# Patient Record
Sex: Female | Born: 1980 | Race: White | Hispanic: No | Marital: Single | Smoking: Never smoker
Health system: Southern US, Community
[De-identification: ages and names within clinical notes are randomized; demographics above are authoritative.]

## PROBLEM LIST (undated history)

## (undated) DIAGNOSIS — G40909 Epilepsy, unspecified, not intractable, without status epilepticus: Secondary | ICD-10-CM

---

## 2018-01-28 ENCOUNTER — Encounter: Payer: Self-pay | Admitting: *Deleted

## 2018-01-28 ENCOUNTER — Emergency Department: Payer: Medicare Other

## 2018-01-28 ENCOUNTER — Emergency Department
Admission: EM | Admit: 2018-01-28 | Discharge: 2018-01-28 | Disposition: A | Discharge status: Home / Self Care | Payer: Medicare Other | Attending: Emergency Medicine | Admitting: Emergency Medicine

## 2018-01-28 ENCOUNTER — Other Ambulatory Visit: Payer: Self-pay

## 2018-01-28 DIAGNOSIS — Y939 Activity, unspecified: Secondary | ICD-10-CM | POA: Insufficient documentation

## 2018-01-28 DIAGNOSIS — Y929 Unspecified place or not applicable: Secondary | ICD-10-CM | POA: Insufficient documentation

## 2018-01-28 DIAGNOSIS — X58XXXA Exposure to other specified factors, initial encounter: Secondary | ICD-10-CM | POA: Insufficient documentation

## 2018-01-28 DIAGNOSIS — M461 Sacroiliitis, not elsewhere classified: Secondary | ICD-10-CM

## 2018-01-28 DIAGNOSIS — Y999 Unspecified external cause status: Secondary | ICD-10-CM | POA: Diagnosis not present

## 2018-01-28 DIAGNOSIS — S39012A Strain of muscle, fascia and tendon of lower back, initial encounter: Secondary | ICD-10-CM

## 2018-01-28 DIAGNOSIS — S3992XA Unspecified injury of lower back, initial encounter: Secondary | ICD-10-CM | POA: Diagnosis present

## 2018-01-28 HISTORY — DX: Epilepsy, unspecified, not intractable, without status epilepticus: G40.909

## 2018-01-28 LAB — URINALYSIS, COMPLETE (UACMP) WITH MICROSCOPIC
BACTERIA UA: NONE SEEN
BILIRUBIN URINE: NEGATIVE
GLUCOSE, UA: NEGATIVE mg/dL
HGB URINE DIPSTICK: NEGATIVE
KETONES UR: NEGATIVE mg/dL
LEUKOCYTES UA: NEGATIVE
NITRITE: NEGATIVE
PROTEIN: NEGATIVE mg/dL
Specific Gravity, Urine: 1.029 (ref 1.005–1.030)
pH: 5 (ref 5.0–8.0)

## 2018-01-28 LAB — POCT PREGNANCY, URINE: Preg Test, Ur: NEGATIVE

## 2018-01-28 MED ORDER — CYCLOBENZAPRINE HCL 10 MG PO TABS: 10.0000 mg | ORAL_TABLET | Freq: Three times a day (TID) | ORAL | 0 refills | Status: AC | PRN

## 2018-01-28 MED ORDER — KETOROLAC TROMETHAMINE 10 MG PO TABS: 10.0000 mg | ORAL_TABLET | Freq: Three times a day (TID) | ORAL | 0 refills | Status: AC

## 2018-01-28 MED ORDER — KETOROLAC TROMETHAMINE 30 MG/ML IJ SOLN
30.0000 mg | Freq: Once | INTRAMUSCULAR | Status: AC
  Administered 2018-01-28: 30 mg via INTRAMUSCULAR
  Filled 2018-01-28: qty 1

## 2018-01-28 MED ORDER — ORPHENADRINE CITRATE 30 MG/ML IJ SOLN
60.0000 mg | INTRAMUSCULAR | Status: AC
  Administered 2018-01-28: 60 mg via INTRAMUSCULAR
  Filled 2018-01-28: qty 2

## 2018-01-28 NOTE — Discharge Instructions (Addendum)
Your exam and x-rays are negative for any acute spinal injury or any acute fractures. You should take the prescription meds as directed. Consider applying moist heat or ice to the sore muscles. Follow-up with your provider or a spine specialist for any ongoing symptoms.
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
## 2019-12-10 IMAGING — CR DG LUMBAR SPINE COMPLETE 4+V
1 series · 5 of 5 positions shown · non-contrast
Comparison: None.

CLINICAL DATA: Bilateral hip and lower back pain. Pain radiates
down left leg.

EXAM:
LUMBAR SPINE - COMPLETE 4+ VIEW

[Series 1: dg lumbar spine complete 4 +v · 0.14mm/px · 5 of 5 slices shown]
[im 1/5]
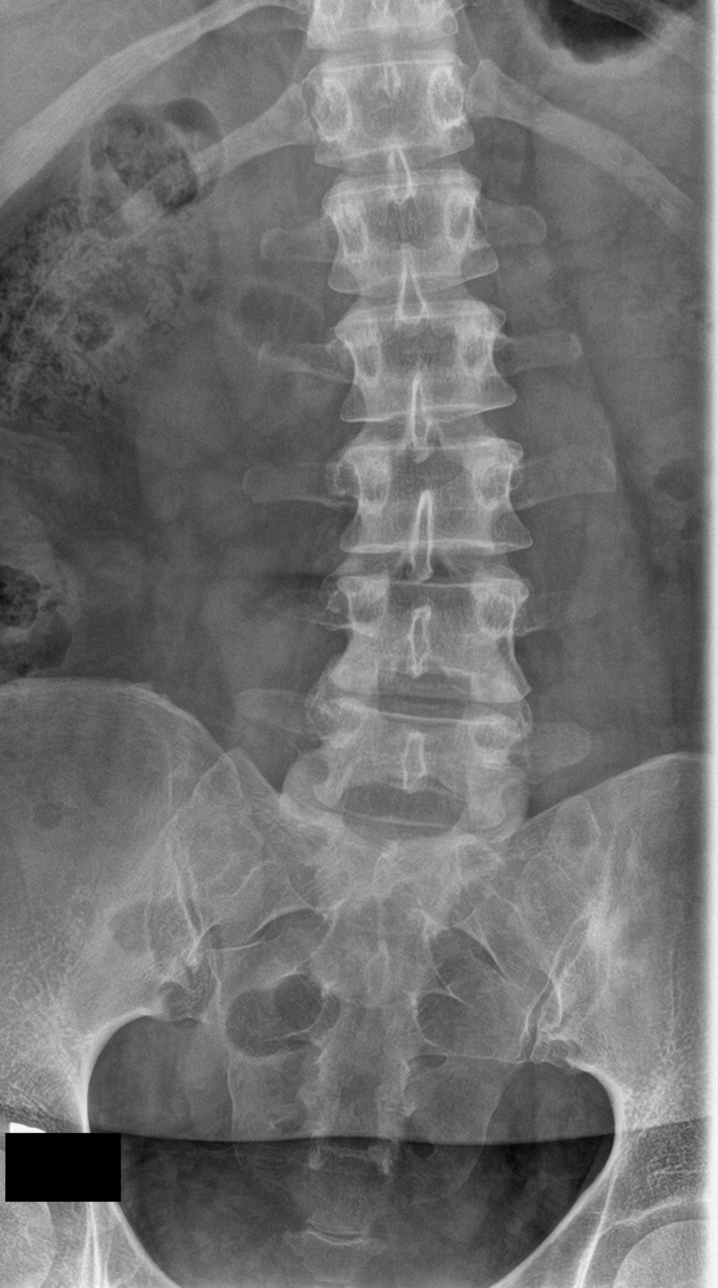
[im 2/5]
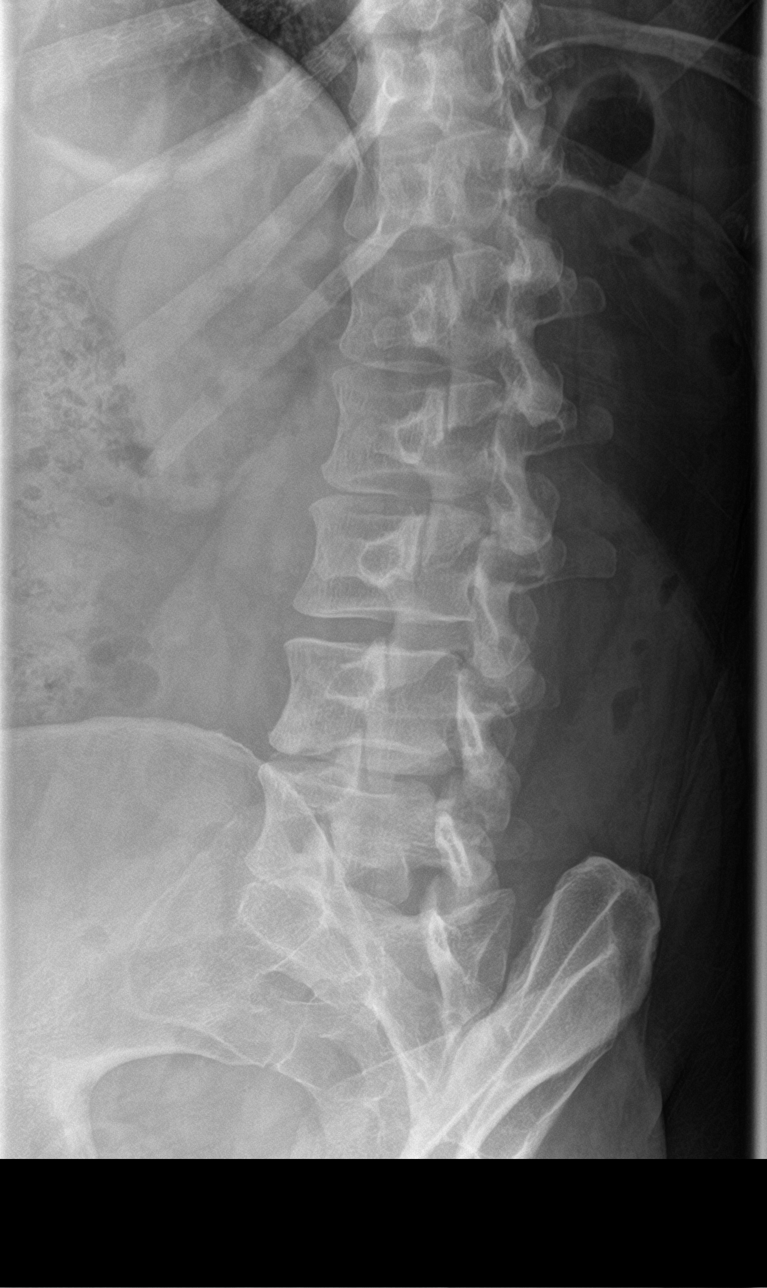
[im 3/5]
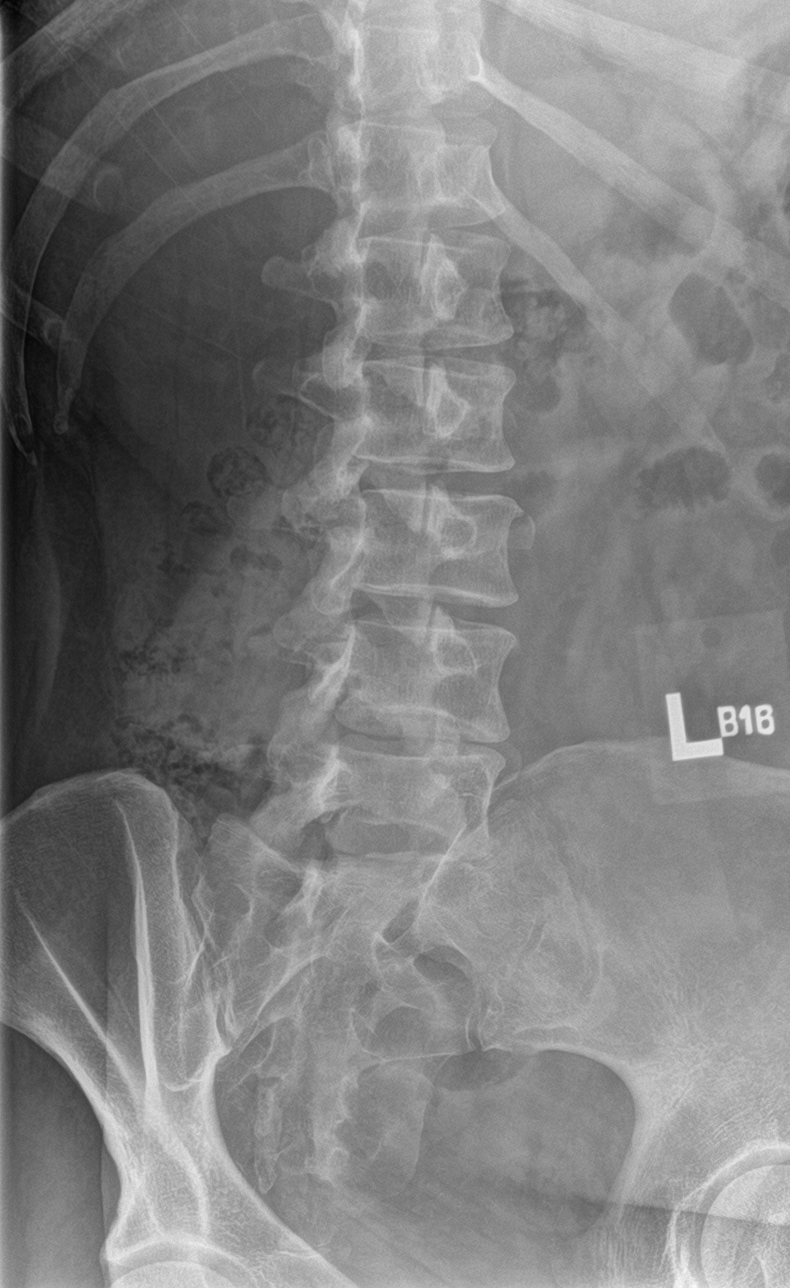
[im 4/5]
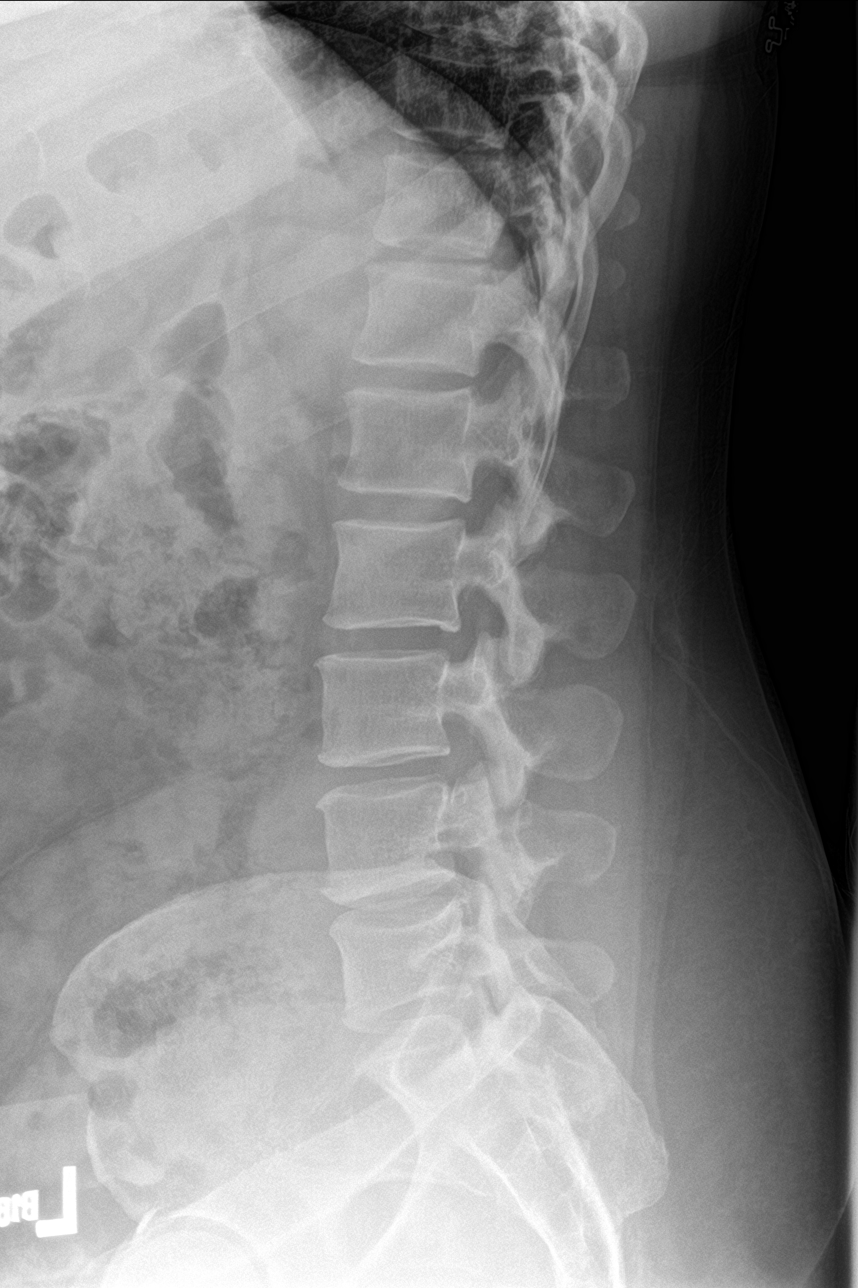
[im 5/5]
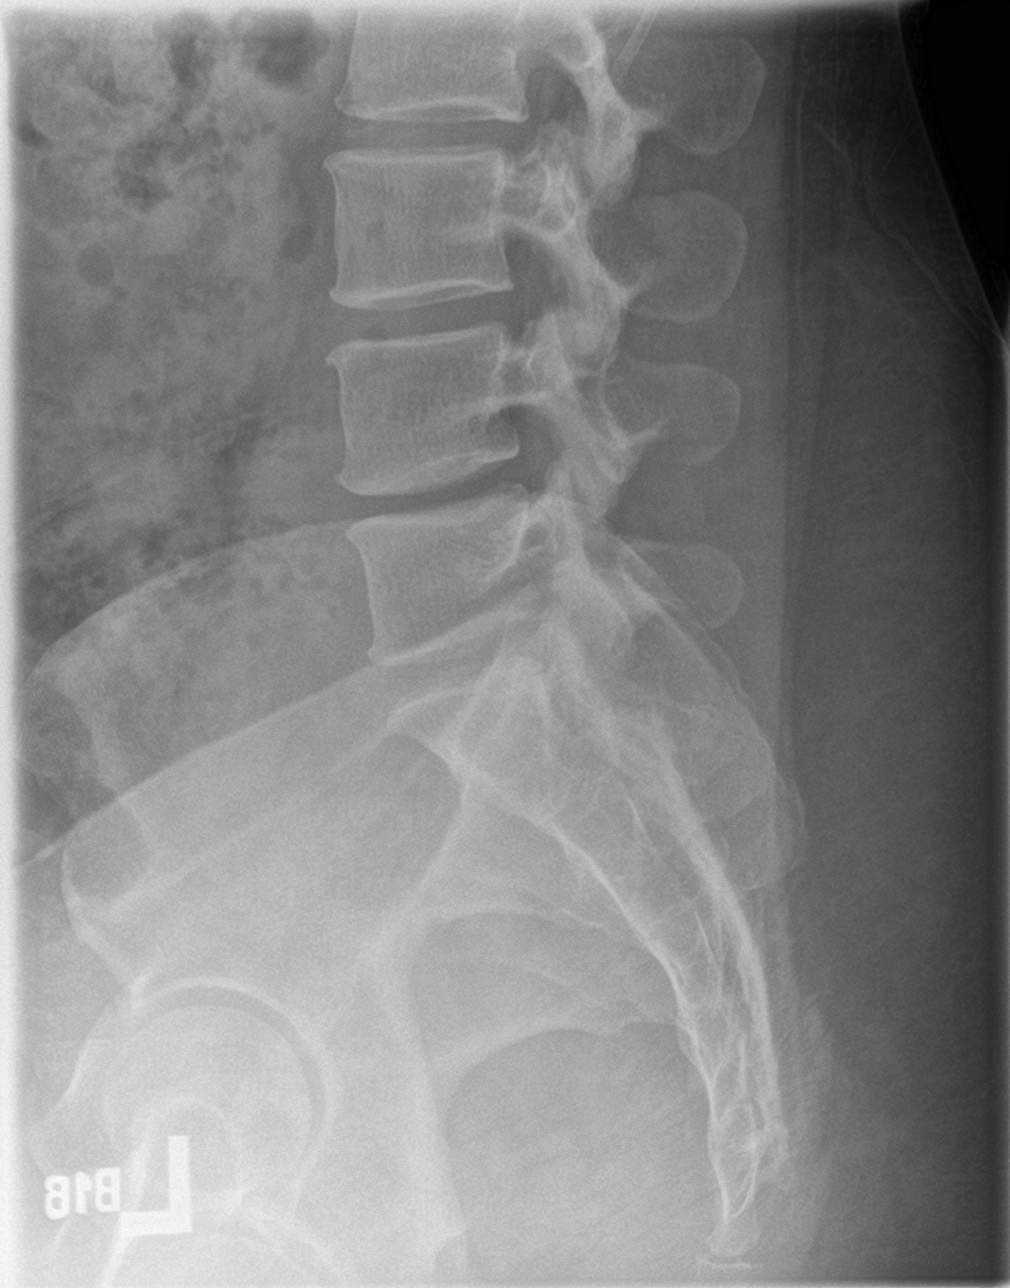

[5 of 5 positions shown; findings below may reference images not displayed]

FINDINGS: There is no evidence of acute lumbar spine fracture. Gentle
levoconvex curvature of the lumbar spine. No pars defects or
listhesis. Intervertebral disc spaces are maintained. No suspicious
osseous lesions. Mild sclerosis of the left SI joint likely
degenerative in etiology.
IMPRESSION: Mild levoconvex curvature of the lumbar spine. No acute osseous
appearing abnormality. Mild sclerosis about the left SI joint may
reflect mild unilateral osteoarthritis or sacroiliitis.
